# Patient Record
Sex: Male | Born: 1980 | Hispanic: No | Marital: Married | Smoking: Never smoker
Health system: Southern US, Community
[De-identification: ages and names within clinical notes are randomized; demographics above are authoritative.]

## PROBLEM LIST (undated history)

## (undated) DIAGNOSIS — A159 Respiratory tuberculosis unspecified: Secondary | ICD-10-CM

---

## 2000-07-12 HISTORY — PX: HERNIA REPAIR: SHX51

## 2005-07-14 ENCOUNTER — Ambulatory Visit: Payer: Self-pay | Admitting: Otolaryngology

## 2010-08-24 ENCOUNTER — Ambulatory Visit: Payer: Self-pay

## 2011-07-13 DIAGNOSIS — A159 Respiratory tuberculosis unspecified: Secondary | ICD-10-CM

## 2011-07-13 HISTORY — DX: Respiratory tuberculosis unspecified: A15.9

## 2014-04-22 ENCOUNTER — Emergency Department: Payer: Self-pay | Admitting: Emergency Medicine

## 2014-04-22 LAB — COMPREHENSIVE METABOLIC PANEL
ALBUMIN: 4.4 g/dL (ref 3.4–5.0)
Alkaline Phosphatase: 103 U/L
Anion Gap: 7 (ref 7–16)
BUN: 7 mg/dL (ref 7–18)
Bilirubin,Total: 0.5 mg/dL (ref 0.2–1.0)
Calcium, Total: 8.8 mg/dL (ref 8.5–10.1)
Chloride: 103 mmol/L (ref 98–107)
Co2: 30 mmol/L (ref 21–32)
Creatinine: 0.9 mg/dL (ref 0.60–1.30)
EGFR (African American): >60
EGFR (Non-African Amer.): >60
Glucose: 85 mg/dL (ref 65–99)
OSMOLALITY: 277 (ref 275–301)
Potassium: 3.5 mmol/L (ref 3.5–5.1)
SGOT(AST): 37 U/L (ref 15–37)
SGPT (ALT): 56 U/L
Sodium: 140 mmol/L (ref 136–145)
Total Protein: 8.4 g/dL — ABNORMAL HIGH (ref 6.4–8.2)

## 2014-04-22 LAB — CBC WITH DIFFERENTIAL/PLATELET
BASOS PCT: 0.3 %
Basophil #: 0 10*3/uL (ref 0.0–0.1)
Eosinophil #: 0.1 10*3/uL (ref 0.0–0.7)
Eosinophil %: 1 %
HCT: 45.8 % (ref 40.0–52.0)
HGB: 14.4 g/dL (ref 13.0–18.0)
LYMPHS PCT: 34.7 %
Lymphocyte #: 3.3 10*3/uL (ref 1.0–3.6)
MCH: 26.2 pg (ref 26.0–34.0)
MCHC: 31.5 g/dL — ABNORMAL LOW (ref 32.0–36.0)
MCV: 83 fL (ref 80–100)
Monocyte #: 0.9 x10 3/mm (ref 0.2–1.0)
Monocyte %: 9.3 %
Neutrophil #: 5.1 10*3/uL (ref 1.4–6.5)
Neutrophil %: 54.7 %
PLATELETS: 261 10*3/uL (ref 150–440)
RBC: 5.5 10*6/uL (ref 4.40–5.90)
RDW: 13.9 % (ref 11.5–14.5)
WBC: 9.4 10*3/uL (ref 3.8–10.6)

## 2014-04-22 LAB — PROTIME-INR
INR: 1.1
Prothrombin Time: 13.8 secs (ref 11.5–14.7)

## 2014-04-22 LAB — APTT: Activated PTT: 27.9 secs (ref 23.6–35.9)

## 2014-04-22 LAB — TROPONIN I: Troponin-I: <0.02 ng/mL

## 2016-07-12 HISTORY — PX: SEPTOPLASTY: SUR1290

## 2018-07-19 ENCOUNTER — Encounter: Payer: Self-pay | Admitting: *Deleted

## 2018-07-19 ENCOUNTER — Other Ambulatory Visit: Payer: Self-pay

## 2018-07-24 NOTE — Discharge Instructions (Signed)
[facility] °[facility] °ENDOSCOPIC SINUS SURGERY ° [facility] ° °What is Functional Endoscopic Sinus Surgery? ° The Surgery involves making the natural openings of the sinuses larger by removing the bony partitions that separate the sinuses from the nasal cavity.  The natural sinus lining is preserved as much as possible to allow the sinuses to resume normal function after the surgery.  In some patients nasal polyps (excessively swollen lining of the sinuses) may be removed to relieve obstruction of the sinus openings.  The surgery is performed through the nose using lighted scopes, which eliminates the need for incisions on the face.  A septoplasty is a different procedure which is sometimes performed with sinus surgery.  It involves straightening the boy partition that separates the two sides of your nose.  A crooked or deviated septum may need repair if is obstructing the sinuses or nasal airflow.  Turbinate reduction is also often performed during sinus surgery.  The turbinates are bony proturberances from the side walls of the nose which swell and can obstruct the nose in patients with sinus and allergy problems.  Their size can be surgically reduced to help relieve nasal obstruction. ° °What Can Sinus Surgery Do For Me? ° Sinus surgery can reduce the frequency of sinus infections requiring antibiotic treatment.  This can provide improvement in nasal congestion, post-nasal drainage, facial pressure and nasal obstruction.  Surgery will NOT prevent you from ever having an infection again, so it usually only for patients who get infections 4 or more times yearly requiring antibiotics, or for infections that do not clear with antibiotics.  It will not cure nasal allergies, so patients with allergies may still require medication to treat their allergies after surgery. Surgery may improve headaches related to sinusitis, however, some people will continue to  require medication to control sinus headaches related to allergies.  Surgery will do nothing for other forms of headache (migraine, tension or cluster). ° °What Are the Risks of Endoscopic Sinus Surgery? ° Current techniques allow surgery to be performed safely with little risk, however, there are rare complications that patients should be aware of.  Because the sinuses are located around the eyes, there is risk of eye injury, including blindness, though again, this would be quite rare. This is usually a result of bleeding behind the eye during surgery, which puts the vision oat risk, though there are treatments to protect the vision and prevent permanent disrupted by surgery causing a leak of the spinal fluid that surrounds the brain.  More serious complications would include bleeding inside the brain cavity or damage to the brain.  Again, all of these complications are uncommon, and spinal fluid leaks can be safely managed surgically if they occur.  The most common complication of sinus surgery is bleeding from the nose, which may require packing or cauterization of the nose.  Continued sinus have polyps may experience recurrence of the polyps requiring revision surgery.  Alterations of sense of smell or injury to the tear ducts are also rare complications.  ° °What is the Surgery Like, and what is the Recovery? ° The Surgery usually takes a couple of hours to perform, and is usually performed under a general anesthetic (completely asleep).  Patients are usually discharged home after a couple of hours.  Sometimes during surgery it is necessary to pack the nose to control bleeding, and the packing is left in place for 24 - 48 hours, and removed by your surgeon.    If a septoplasty was performed during the procedure, there is often a splint placed which must be removed after 5-7 days.   °Discomfort: Pain is usually mild to moderate, and can be controlled by prescription pain medication or acetaminophen (Tylenol).   Aspirin, Ibuprofen (Advil, Motrin), or Naprosyn (Aleve) should be avoided, as they can cause increased bleeding.  Most patients feel sinus pressure like they have a bad head cold for several days.  Sleeping with your head elevated can help reduce swelling and facial pressure, as can ice packs over the face.  A humidifier may be helpful to keep the mucous and blood from drying in the nose.  ° °Diet: There are no specific diet restrictions, however, you should generally start with clear liquids and a light diet of bland foods because the anesthetic can cause some nausea.  Advance your diet depending on how your stomach feels.  Taking your pain medication with food will often help reduce stomach upset which pain medications can cause. ° °Nasal Saline Irrigation: It is important to remove blood clots and dried mucous from the nose as it is healing.  This is done by having you irrigate the nose at least 3 - 4 times daily with a salt water solution.  We recommend using NeilMed Sinus Rinse (available at the drug store).  Fill the squeeze bottle with the solution, bend over a sink, and insert the tip of the squeeze bottle into the nose ½ of an inch.  Point the tip of the squeeze bottle towards the inside corner of the eye on the same side your irrigating.  Squeeze the bottle and gently irrigate the nose.  If you bend forward as you do this, most of the fluid will flow back out of the nose, instead of down your throat.   The solution should be warm, near body temperature, when you irrigate.   Each time you irrigate, you should use a full squeeze bottle.  ° °Note that if you are instructed to use Nasal Steroid Sprays at any time after your surgery, irrigate with saline BEFORE using the steroid spray, so you do not wash it all out of the nose. °Another product, Nasal Saline Gel (such as AYR Nasal Saline Gel) can be applied in each nostril 3 - 4 times daily to moisture the nose and reduce scabbing or crusting. ° °Bleeding:   Bloody drainage from the nose can be expected for several days, and patients are instructed to irrigate their nose frequently with salt water to help remove mucous and blood clots.  The drainage may be dark red or brown, though some fresh blood may be seen intermittently, especially after irrigation.  Do not blow you nose, as bleeding may occur. If you must sneeze, keep your mouth open to allow air to escape through your mouth. ° °If heavy bleeding occurs: Irrigate the nose with saline to rinse out clots, then spray the nose 3 - 4 times with Afrin Nasal Decongestant Spray.  The spray will constrict the blood vessels to slow bleeding.  Pinch the lower half of your nose shut to apply pressure, and lay down with your head elevated.  Ice packs over the nose may help as well. If bleeding persists despite these measures, you should notify your doctor.  Do not use the Afrin routinely to control nasal congestion after surgery, as it can result in worsening congestion and may affect healing.  ° °Activity: Return to work varies among patients. Most patients will be out of   work at least 5 - 7 days to recover.  Patient may return to work after they are off of narcotic pain medication, and feeling well enough to perform the functions of their job.  Patients must avoid heavy lifting (over 10 pounds) or strenuous physical for 2 weeks after surgery, so your employer may need to assign you to light duty, or keep you out of work longer if light duty is not possible.  NOTE: you should not drive, operate dangerous machinery, do any mentally demanding tasks or make any important legal or financial decisions while on narcotic pain medication and recovering from the general anesthetic.  °  °Call Your Doctor Immediately if You Have Any of the Following: °1. Bleeding that you cannot control with the above measures °2. Loss of vision, double vision, bulging of the eye or black eyes. °3. Fever over 101 degrees °4. Neck stiffness with severe  headache, fever, nausea and change in mental state. °You are always encourage to call anytime with concerns, however, please call with requests for pain medication refills during office hours. ° °Office Endoscopy: During follow-up visits your doctor will remove any packing or splints that may have been placed and evaluate and clean your sinuses endoscopically.  Topical anesthetic will be used to make this as comfortable as possible, though you may want to take your pain medication prior to the visit.  How often this will need to be done varies from patient to patient.  After complete recovery from the surgery, you may need follow-up endoscopy from time to time, particularly if there is concern of recurrent infection or nasal polyps. ° ° °General Anesthesia, Adult, Care After °This sheet gives you information about how to care for yourself after your procedure. Your health care provider may also give you more specific instructions. If you have problems or questions, contact your health care provider. °What can I expect after the procedure? °After the procedure, the following side effects are common: °· Pain or discomfort at the IV site. °· Nausea. °· Vomiting. °· Sore throat. °· Trouble concentrating. °· Feeling cold or chills. °· Weak or tired. °· Sleepiness and fatigue. °· Soreness and body aches. These side effects can affect parts of the body that were not involved in surgery. °Follow these instructions at home: ° °For at least 24 hours after the procedure: °· Have a responsible adult stay with you. It is important to have someone help care for you until you are awake and alert. °· Rest as needed. °· Do not: °? Participate in activities in which you could fall or become injured. °? Drive. °? Use heavy machinery. °? Drink alcohol. °? Take sleeping pills or medicines that cause drowsiness. °? Make important decisions or sign legal documents. °? Take care of children on your own. °Eating and drinking °· Follow any  instructions from your health care provider about eating or drinking restrictions. °· When you feel hungry, start by eating small amounts of foods that are soft and easy to digest (bland), such as toast. Gradually return to your regular diet. °· Drink enough fluid to keep your urine pale yellow. °· If you vomit, rehydrate by drinking water, juice, or clear broth. °General instructions °· If you have sleep apnea, surgery and certain medicines can increase your risk for breathing problems. Follow instructions from your health care provider about wearing your sleep device: °? Anytime you are sleeping, including during daytime naps. °? While taking prescription pain medicines, sleeping medicines, or medicines that make   you drowsy. °· Return to your normal activities as told by your health care provider. Ask your health care provider what activities are safe for you. °· Take over-the-counter and prescription medicines only as told by your health care provider. °· If you smoke, do not smoke without supervision. °· Keep all follow-up visits as told by your health care provider. This is important. °Contact a health care provider if: °· You have nausea or vomiting that does not get better with medicine. °· You cannot eat or drink without vomiting. °· You have pain that does not get better with medicine. °· You are unable to pass urine. °· You develop a skin rash. °· You have a fever. °· You have redness around your IV site that gets worse. °Get help right away if: °· You have difficulty breathing. °· You have chest pain. °· You have blood in your urine or stool, or you vomit blood. °Summary °· After the procedure, it is common to have a sore throat or nausea. It is also common to feel tired. °· Have a responsible adult stay with you for the first 24 hours after general anesthesia. It is important to have someone help care for you until you are awake and alert. °· When you feel hungry, start by eating small amounts of foods  that are soft and easy to digest (bland), such as toast. Gradually return to your regular diet. °· Drink enough fluid to keep your urine pale yellow. °· Return to your normal activities as told by your health care provider. Ask your health care provider what activities are safe for you. °This information is not intended to replace advice given to you by your health care provider. Make sure you discuss any questions you have with your health care provider. °Document Released: 10/04/2000 Document Revised: 02/11/2017 Document Reviewed: 02/11/2017 °Elsevier Interactive Patient Education © 2019 [employer] ° °

## 2018-07-25 ENCOUNTER — Ambulatory Visit: Payer: Managed Care, Other (non HMO) | Admitting: Anesthesiology

## 2018-07-25 ENCOUNTER — Ambulatory Visit
Admission: RE | Admit: 2018-07-25 | Discharge: 2018-07-25 | Disposition: A | Discharge status: Home / Self Care | Payer: Managed Care, Other (non HMO) | Attending: Otolaryngology | Admitting: Otolaryngology

## 2018-07-25 ENCOUNTER — Encounter
Admission: RE | Disposition: A | Discharge status: Home / Self Care | Payer: Self-pay | Source: Home / Self Care | Attending: Otolaryngology

## 2018-07-25 DIAGNOSIS — J3489 Other specified disorders of nose and nasal sinuses: Secondary | ICD-10-CM | POA: Insufficient documentation

## 2018-07-25 DIAGNOSIS — J329 Chronic sinusitis, unspecified: Secondary | ICD-10-CM | POA: Diagnosis not present

## 2018-07-25 DIAGNOSIS — Z79899 Other long term (current) drug therapy: Secondary | ICD-10-CM | POA: Diagnosis not present

## 2018-07-25 DIAGNOSIS — J339 Nasal polyp, unspecified: Secondary | ICD-10-CM | POA: Insufficient documentation

## 2018-07-25 DIAGNOSIS — J342 Deviated nasal septum: Secondary | ICD-10-CM | POA: Insufficient documentation

## 2018-07-25 DIAGNOSIS — J328 Other chronic sinusitis: Secondary | ICD-10-CM | POA: Insufficient documentation

## 2018-07-25 HISTORY — PX: POLYPECTOMY: SHX149

## 2018-07-25 HISTORY — DX: Respiratory tuberculosis unspecified: A15.9

## 2018-07-25 HISTORY — PX: IMAGE GUIDED SINUS SURGERY: SHX6570

## 2018-07-25 HISTORY — PX: ETHMOIDECTOMY: SHX5197

## 2018-07-25 HISTORY — PX: SEPTOPLASTY: SHX2393

## 2018-07-25 HISTORY — PX: MAXILLARY ANTROSTOMY: SHX2003

## 2018-07-25 SURGERY — SINUS SURGERY, WITH IMAGING GUIDANCE
Anesthesia: General | Site: Nose | Laterality: Bilateral

## 2018-07-25 MED ORDER — OXYCODONE HCL 5 MG/5ML PO SOLN: 5.0000 mg | Freq: Once | ORAL | Status: AC | PRN

## 2018-07-25 MED ORDER — PROPOFOL 10 MG/ML IV BOLUS
INTRAVENOUS | Status: DC | PRN
  Administered 2018-07-25: 150 mg via INTRAVENOUS

## 2018-07-25 MED ORDER — DEXAMETHASONE SODIUM PHOSPHATE 4 MG/ML IJ SOLN
INTRAMUSCULAR | Status: DC | PRN
  Administered 2018-07-25: 10 mg via INTRAVENOUS

## 2018-07-25 MED ORDER — LIDOCAINE-EPINEPHRINE 1 %-1:100000 IJ SOLN
INTRAMUSCULAR | Status: DC | PRN
  Administered 2018-07-25: 10 mL

## 2018-07-25 MED ORDER — OXYMETAZOLINE HCL 0.05 % NA SOLN
NASAL | Status: DC | PRN
  Administered 2018-07-25: 1 via TOPICAL

## 2018-07-25 MED ORDER — HYDROCODONE-ACETAMINOPHEN 5-325 MG PO TABS: 1.0000 | ORAL_TABLET | ORAL | 0 refills | Status: DC | PRN

## 2018-07-25 MED ORDER — LIDOCAINE HCL (CARDIAC) PF 100 MG/5ML IV SOSY
PREFILLED_SYRINGE | INTRAVENOUS | Status: DC | PRN
  Administered 2018-07-25: 40 mg via INTRAVENOUS

## 2018-07-25 MED ORDER — LACTATED RINGERS IV SOLN
INTRAVENOUS | Status: DC | PRN
  Administered 2018-07-25 (×2): via INTRAVENOUS

## 2018-07-25 MED ORDER — FENTANYL CITRATE (PF) 100 MCG/2ML IJ SOLN
INTRAMUSCULAR | Status: DC | PRN
  Administered 2018-07-25: 50 ug via INTRAVENOUS
  Administered 2018-07-25: 100 ug via INTRAVENOUS
  Administered 2018-07-25: 25 ug via INTRAVENOUS
  Administered 2018-07-25 (×2): 50 ug via INTRAVENOUS
  Administered 2018-07-25: 25 ug via INTRAVENOUS

## 2018-07-25 MED ORDER — LABETALOL HCL 5 MG/ML IV SOLN
INTRAVENOUS | Status: DC | PRN
  Administered 2018-07-25 (×2): 5 mg via INTRAVENOUS

## 2018-07-25 MED ORDER — HYDROMORPHONE HCL 1 MG/ML IJ SOLN: 0.2500 mg | INTRAMUSCULAR | Status: DC | PRN

## 2018-07-25 MED ORDER — GLYCOPYRROLATE 0.2 MG/ML IJ SOLN
INTRAMUSCULAR | Status: DC | PRN
  Administered 2018-07-25: 0.1 mg via INTRAVENOUS

## 2018-07-25 MED ORDER — OXYCODONE HCL 5 MG PO TABS
5.0000 mg | ORAL_TABLET | Freq: Once | ORAL | Status: AC | PRN
  Administered 2018-07-25: 5 mg via ORAL

## 2018-07-25 MED ORDER — ONDANSETRON HCL 4 MG/2ML IJ SOLN
INTRAMUSCULAR | Status: DC | PRN
  Administered 2018-07-25: 4 mg via INTRAVENOUS

## 2018-07-25 MED ORDER — ROCURONIUM BROMIDE 100 MG/10ML IV SOLN
INTRAVENOUS | Status: DC | PRN
  Administered 2018-07-25: 25 mg via INTRAVENOUS

## 2018-07-25 MED ORDER — MIDAZOLAM HCL 5 MG/5ML IJ SOLN
INTRAMUSCULAR | Status: DC | PRN
  Administered 2018-07-25: 2 mg via INTRAVENOUS

## 2018-07-25 SURGICAL SUPPLY — 46 items
BATTERY INSTRU NAVIGATION (MISCELLANEOUS) ×9 IMPLANT
BLADE SURG 15 STRL LF DISP TIS (BLADE) IMPLANT
BLADE SURG 15 STRL SS (BLADE) ×2
BOWL GRADUATED 16OZ PLASTIC (MISCELLANEOUS) ×4 IMPLANT
CANISTER SUCT 1200ML W/VALVE (MISCELLANEOUS) ×3 IMPLANT
COAGULATOR SUCT 8FR VV (MISCELLANEOUS) ×2 IMPLANT
COUNTER NEEDLE 20/40 LG (NEEDLE) ×2 IMPLANT
COVER LIGHT HANDLE FLEXIBLE (MISCELLANEOUS) ×4 IMPLANT
COVER MAYO STAND STRL (DRAPES) ×2 IMPLANT
COVER TABLE BACK 60X90 (DRAPES) ×2 IMPLANT
CUP MEDICINE 2OZ PLAST GRAD ST (MISCELLANEOUS) ×8 IMPLANT
DRAPE EENT SPLIT AURORA (DRAPES) ×2 IMPLANT
DRAPE HEAD BAR (DRAPES) ×2 IMPLANT
DRESSING NASL FOAM PST OP SINU (MISCELLANEOUS) IMPLANT
DRSG NASAL FOAM POST OP SINU (MISCELLANEOUS) ×6
ELECT REM PT RETURN 9FT ADLT (ELECTROSURGICAL) ×3
ELECTRODE REM PT RTRN 9FT ADLT (ELECTROSURGICAL) ×1 IMPLANT
GLOVE BIO SURGEON STRL SZ7.5 (GLOVE) ×8 IMPLANT
GOWN STRL REUS W/ TWL LRG LVL3 (GOWN DISPOSABLE) ×1 IMPLANT
GOWN STRL REUS W/TWL LRG LVL3 (GOWN DISPOSABLE) ×8
IV NS 500ML (IV SOLUTION) ×2
IV NS 500ML BAXH (IV SOLUTION) ×1 IMPLANT
KIT TURNOVER KIT A (KITS) ×3 IMPLANT
MARKER SKIN DUAL TIP RULER LAB (MISCELLANEOUS) ×2 IMPLANT
NDL HYPO 25GX1X1/2 BEV (NEEDLE) IMPLANT
NEEDLE HYPO 25GX1X1/2 BEV (NEEDLE) ×3 IMPLANT
NS IRRIG 500ML POUR BTL (IV SOLUTION) ×3 IMPLANT
PACKING NASAL EPIS 4X2.4 XEROG (MISCELLANEOUS) ×2 IMPLANT
PATTIES SURGICAL .5 X3 (DISPOSABLE) ×3 IMPLANT
SHAVER DIEGO BLD STD TYPE A (BLADE) ×3 IMPLANT
SOL ANTI-FOG 6CC FOG-OUT (MISCELLANEOUS) ×1 IMPLANT
SOL FOG-OUT ANTI-FOG 6CC (MISCELLANEOUS) ×2
SPLINT NASAL SEPTAL PRE-CUT (MISCELLANEOUS) ×2 IMPLANT
SPONGE NEURO XRAY DETECT 1X3 (DISPOSABLE) ×2 IMPLANT
SUT CHROMIC 4 0 RB 1X27 (SUTURE) IMPLANT
SUT ETHILON 4-0 (SUTURE)
SUT ETHILON 4-0 FS2 18XMFL BLK (SUTURE)
SUT PLAIN GUT 4-0 (SUTURE) IMPLANT
SUTURE ETHLN 4-0 FS2 18XMF BLK (SUTURE) IMPLANT
SYR 10ML LL (SYRINGE) ×5 IMPLANT
TOWEL SURG RFD BLUE STRL DISP (DISPOSABLE) ×4 IMPLANT
TRACKER CRANIALMASK (MASK) ×3 IMPLANT
TUBING CONNECTING 10 (TUBING) ×1 IMPLANT
TUBING CONNECTING 10' (TUBING) ×1
TUBING DECLOG MULTIDEBRIDER (TUBING) ×3 IMPLANT
WATER STERILE IRR 250ML POUR (IV SOLUTION) IMPLANT

## 2018-07-25 NOTE — Anesthesia Preprocedure Evaluation (Addendum)
Anesthesia Evaluation  Patient identified by MRN, date of birth, ID band Patient awake    Reviewed: Allergy & Precautions, H&P , NPO status , Patient's Chart, lab work & pertinent test results  Airway Mallampati: II  TM Distance: >3 FB Neck ROM: full    Dental no notable dental hx.    Pulmonary neg pulmonary ROS,    Pulmonary exam normal breath sounds clear to auscultation       Cardiovascular negative cardio ROS Normal cardiovascular exam     Neuro/Psych    GI/Hepatic negative GI ROS, Neg liver ROS,   Endo/Other  negative endocrine ROS  Renal/GU negative Renal ROS     Musculoskeletal   Abdominal   Peds  Hematology negative hematology ROS (+)   Anesthesia Other Findings   Reproductive/Obstetrics negative OB ROS                             Anesthesia Physical Anesthesia Plan  ASA: I  Anesthesia Plan: General ETT   Post-op Pain Management:    Induction:   PONV Risk Score and Plan:   Airway Management Planned:   Additional Equipment:   Intra-op Plan:   Post-operative Plan:   Informed Consent: I have reviewed the patients History and Physical, chart, labs and discussed the procedure including the risks, benefits and alternatives for the proposed anesthesia with the patient or authorized representative who has indicated his/her understanding and acceptance.       Plan Discussed with:   Anesthesia Plan Comments:         Anesthesia Quick Evaluation

## 2018-07-25 NOTE — Op Note (Signed)
07/25/2018  1:59 PM    Governor Rooks Elane Fritz  [id number]   Pre-Op Diagnosis:  Nasal Polyposis, Chronic sinusitis, Septal Deviation with Nasal obstruction  Post-op Diagnosis: Same   Procedure:  1)  Image Guided Sinus Surgery,   2)  Bilateral Endoscopic Maxillary Antrostomy with Tissue Removal   3)  Bilateral Total Ethmoidectomy   4)  Bilateral Sphenoidotomy   5)  Nasal Septoplasty    Surgeon:  Sandi Mealy  Anesthesia:  General endotracheal  EBL:  200cc  Complications:  None  Findings: Right septal deviation, Bilateral extensive nasal polyposis  Procedure: After the patient was identified in holding and the benefits of the procedure were reviewed as well as the consent and risks, the patient was taken to the operating room and with the patient in a comfortable supine position,  general orotracheal anesthesia was induced without difficulty.  A proper time-out was performed.  The Stryker image guidance system was set up and calibrated in the normal fashion and felt to be acceptable.  Next 1% Xylocaine with 1:100,000 epinephrine was infiltrated into the septum, and anterior middle turbinates, polyps, and uncinate region  bilaterally.  Several minutes were allowed for this to take effect.  Cottoniod pledgets soaked in Afrin were placed into both nasal cavities and left while the patient was prepped and draped in the standard fashion. The image guided suction was calibrated and used to inspect known points in the nasal cavity to assess accuracy of the image guided system. Accuracy was felt to be excellent.   Examination of the septum revealed a broad-based synechia between the right anterior septum and lateral nasal wall. This was divided with scissors. Beginning on the left hand side a hemitransfixion incision was then created on the leading edge of the septum on the left.  A subperichondrial plane was elevated posteriorly on the left andright hand sides of the cartilage to mobilize it, and taken  back to the perpendicular plate of the ethmoid where subperiosteal plane was elevated. The cartilage was freed up from the bone for better mobilization, as it was scarred, overlapping the bone on the right. A portion of the bone was trimmed to allow the cartilage to slide into midline. An inferior rim of redundant septal cartilage was removed from where it deviated over the maxillary crest. The cartilage was scored on the left to allow relaxation of the septal cartilage into the midline.   The septum was then replaced in the midline. Reinspection through each nostril showed excellent reduction of the septal deformity. A left posterior inferior fenestration was then created to allow hematoma drainage.  The septal incision was closed with 4-0 chromic gut suture. A 4-0 plain gut suture was used to reapproximate the septal flaps to the underlying cartilage, utilizing a running, quilting type stitch.   The left middle turbinate was medialized and polyps debrided from the middle meatus.  This was initially done with forceps as the microdebrider was not working properly, and not available.  A backbiter was used to get into the left maxillary sinus, debriding polyps and uncinate bone to enter the maxillary sinus.  The sinus was suctioned of secretions.  Later in the case the microdebrider was available for use to help debride polyps from the opening of the maxillary sinus.  In this fashion the uncinate was completely removed along with soft tissue and bone of the medial wall of the maxillary sinus to create a large patent maxillary antrostomy. The left maxillary sinus was suctioned to clear secretions.  Next polyps were debrided from the ethmoid sinuses, again initially using cup forceps and through-cutting forceps to enter through the basal lamella into the posterior ethmoids.  Later the microdebrider was used as needed to further debride polyps and open the ethmoid sinuses widely.  The image guided suction was used  regularly throughout the case to confirm the anatomy and help avoid injury to the skull base and lamina papyracea.  Next the scope was passed medial to the middle turbinate and the left sphenoid recess inspected.  Polyps were debrided from this region and were fairly extensive.  With the assistance of the image guided system, the sphenoid sinus was carefully entered through some thin bone and polyps at the anterior face of the spenoid, medial to the superior turbinate.  Polyps were debrided creating a fairly large sphenoidotomy, as the natural ostia of the sphenoid appeared to have been dilated by the presence of polyps.  The microdebrider was used to further debride polyps from this region creating wide patency of the sphenoid sinus.    Attention was then turned to the right side where the same procedure was performed, opening the maxillary sinus, ethmoid cavities, and sphenoid sinus in the same fashion as described above, and debriding significant polyps from the sinuses.  Once both sides were completed, both sides were inspected for residual bleeding.  Suction cautery was used to cauterize areas of bleeding in the sphenoid recess bilaterally and posterior ethmoid region.  A combination of xerogel and Stammberger absorbable packing was placed into the ethmoids on both sides to control residual oozing.  With the sinus surgery completed bilaterally and no active bleeding, nasal septal splints were placed within each nostril and affixed to the septum using a 3-0 nylon suture.   The nose was suctioned and inspected. The maxillary sinuses were irrigated with saline. Stammberger absorbable sinus packing was then placed in the ethmoid cavities bilaterally.   The patient was then returned to the anesthesiologist for awakening and taken to recovery room in good condition postoperatively.  Disposition:   PACU and d/c home  Plan: Ice, elevation, narcotic analgesia and prophylactic antibiotics. Begin sinus  irrigations with saline tomrrow, irrigating 3-4 times daily. Return to the office in 7 days.  Return to work in 7-10 days, no strenuous activities for two weeks.   Sandi Mealy 07/25/2018 1:59 PM

## 2018-07-25 NOTE — Anesthesia Postprocedure Evaluation (Signed)
Anesthesia Post Note  Patient: Jeremiah Hudson  Procedure(s) Performed: IMAGE GUIDED SINUS SURGERY (Bilateral Nose) POLYPECTOMY NASAL (Bilateral Nose) MAXILLARY ANTROSTOMY WITH TISSUE REMOVAL (Bilateral Nose) ETHMOIDECTOMY AND SPHENOIDOTOMY (Bilateral Nose) REVISION SEPTOPLASTY (Bilateral Nose)  Patient location during evaluation: PACU Anesthesia Type: General Level of consciousness: awake and alert Pain management: pain level controlled Vital Signs Assessment: post-procedure vital signs reviewed and stable Respiratory status: spontaneous breathing Cardiovascular status: blood pressure returned to baseline Anesthetic complications: no    Verner Chol, III,  Caydan Mctavish D

## 2018-07-25 NOTE — Anesthesia Procedure Notes (Signed)
Procedure Name: Intubation Date/Time: 07/25/2018 10:30 AM Performed by: Jimmy Picket, CRNA Pre-anesthesia Checklist: Patient identified, Emergency Drugs available, Suction available, Patient being monitored and Timeout performed Patient Re-evaluated:Patient Re-evaluated prior to induction Oxygen Delivery Method: Circle system utilized Preoxygenation: Pre-oxygenation with 100% oxygen Induction Type: IV induction Ventilation: Mask ventilation without difficulty Laryngoscope Size: Miller and 3 Grade View: Grade I Tube type: Oral Rae Tube size: 7.5 mm Number of attempts: 1 Placement Confirmation: ETT inserted through vocal cords under direct vision,  positive ETCO2 and breath sounds checked- equal and bilateral Tube secured with: Tape Dental Injury: Teeth and Oropharynx as per pre-operative assessment

## 2018-07-25 NOTE — Transfer of Care (Signed)
Immediate Anesthesia Transfer of Care Note  Patient: Jeremiah Hudson  Procedure(s) Performed: IMAGE GUIDED SINUS SURGERY (Bilateral Nose) POLYPECTOMY NASAL (Bilateral Nose) MAXILLARY ANTROSTOMY WITH TISSUE REMOVAL (Bilateral Nose) ETHMOIDECTOMY AND SPHENOIDOTOMY (Bilateral Nose) REVISION SEPTOPLASTY (Bilateral Nose)  Patient Location: PACU  Anesthesia Type: General ETT  Level of Consciousness: awake, alert  and patient cooperative  Airway and Oxygen Therapy: Patient Spontanous Breathing and Patient connected to supplemental oxygen  Post-op Assessment: Post-op Vital signs reviewed, Patient's Cardiovascular Status Stable, Respiratory Function Stable, Patent Airway and No signs of Nausea or vomiting  Post-op Vital Signs: Reviewed and stable  Complications: No apparent anesthesia complications

## 2018-07-25 NOTE — H&P (Signed)
History and physical reviewed and will be scanned in later. No change in medical status reported by the patient or family, appears stable for surgery. All questions regarding the procedure answered, and patient (or family if a child) expressed understanding of the procedure. ? ?Tregan Read S Jamier Urbas ?@TODAY@ ?

## 2018-07-26 ENCOUNTER — Encounter: Payer: Self-pay | Admitting: Otolaryngology

## 2018-07-31 LAB — SURGICAL PATHOLOGY

## 2018-10-19 ENCOUNTER — Other Ambulatory Visit: Payer: Self-pay

## 2018-10-19 ENCOUNTER — Encounter: Payer: Self-pay | Admitting: Emergency Medicine

## 2018-10-19 ENCOUNTER — Emergency Department: Payer: Managed Care, Other (non HMO)

## 2018-10-19 ENCOUNTER — Emergency Department
Admission: EM | Admit: 2018-10-19 | Discharge: 2018-10-19 | Disposition: A | Discharge status: Home / Self Care | Payer: Managed Care, Other (non HMO) | Attending: Emergency Medicine | Admitting: Emergency Medicine

## 2018-10-19 DIAGNOSIS — R059 Cough, unspecified: Secondary | ICD-10-CM

## 2018-10-19 DIAGNOSIS — R0789 Other chest pain: Secondary | ICD-10-CM | POA: Diagnosis present

## 2018-10-19 DIAGNOSIS — R05 Cough: Secondary | ICD-10-CM | POA: Diagnosis not present

## 2018-10-19 DIAGNOSIS — Z79899 Other long term (current) drug therapy: Secondary | ICD-10-CM | POA: Diagnosis not present

## 2018-10-19 LAB — CBC WITH DIFFERENTIAL/PLATELET
Abs Immature Granulocytes: 0.01 10*3/uL (ref 0.00–0.07)
Basophils Absolute: 0 10*3/uL (ref 0.0–0.1)
Basophils Relative: 1 %
Eosinophils Absolute: 0.3 10*3/uL (ref 0.0–0.5)
Eosinophils Relative: 4 %
HCT: 45.3 % (ref 39.0–52.0)
Hemoglobin: 14.6 g/dL (ref 13.0–17.0)
Immature Granulocytes: 0 %
Lymphocytes Relative: 30 %
Lymphs Abs: 2.4 10*3/uL (ref 0.7–4.0)
MCH: 26.8 pg (ref 26.0–34.0)
MCHC: 32.2 g/dL (ref 30.0–36.0)
MCV: 83.3 fL (ref 80.0–100.0)
Monocytes Absolute: 0.6 10*3/uL (ref 0.1–1.0)
Monocytes Relative: 8 %
Neutro Abs: 4.7 10*3/uL (ref 1.7–7.7)
Neutrophils Relative %: 57 %
Platelets: 302 10*3/uL (ref 150–400)
RBC: 5.44 MIL/uL (ref 4.22–5.81)
RDW: 13.7 % (ref 11.5–15.5)
WBC: 8 10*3/uL (ref 4.0–10.5)
nRBC: 0 % (ref 0.0–0.2)

## 2018-10-19 LAB — COMPREHENSIVE METABOLIC PANEL
ALT: 45 U/L — ABNORMAL HIGH (ref 0–44)
AST: 28 U/L (ref 15–41)
Albumin: 4.5 g/dL (ref 3.5–5.0)
Alkaline Phosphatase: 115 U/L (ref 38–126)
Anion gap: 9 (ref 5–15)
BUN: 9 mg/dL (ref 6–20)
CO2: 25 mmol/L (ref 22–32)
Calcium: 9.4 mg/dL (ref 8.9–10.3)
Chloride: 106 mmol/L (ref 98–111)
Creatinine, Ser: 0.79 mg/dL (ref 0.61–1.24)
GFR calc Af Amer: >60 mL/min (ref >60)
GFR calc non Af Amer: >60 mL/min (ref >60)
Glucose, Bld: 157 mg/dL — ABNORMAL HIGH (ref 70–99)
Potassium: 3.8 mmol/L (ref 3.5–5.1)
Sodium: 140 mmol/L (ref 135–145)
Total Bilirubin: 0.4 mg/dL (ref 0.3–1.2)
Total Protein: 8 g/dL (ref 6.5–8.1)

## 2018-10-19 LAB — BRAIN NATRIURETIC PEPTIDE: B Natriuretic Peptide: 14 pg/mL (ref 0.0–100.0)

## 2018-10-19 LAB — FIBRIN DERIVATIVES D-DIMER (ARMC ONLY): Fibrin derivatives D-dimer (ARMC): 416.95 ng/mL (FEU) (ref 0.00–499.00)

## 2018-10-19 LAB — TROPONIN I: Troponin I: <0.03 ng/mL (ref <0.03)

## 2018-10-19 NOTE — ED Triage Notes (Addendum)
Pt c/o CP and chills. Pt was tested for covid-19 last week and was neg but states symptoms are not improving. PT has + travel to [place] x43month ago. PT in NAD, appears anxious about having covid virus

## 2018-10-19 NOTE — ED Notes (Signed)
[phone] pt cell phone number

## 2018-10-19 NOTE — Discharge Instructions (Addendum)
You have some of the symptoms of the coronavirus infection but not most of them.  The lab test that we have done today are negative.  Please continue quarantine until you been without any fever for at least 3 days without any fever medicine and your cough is improved.  If you get worse develop a fever with a thermometer or get more short of breath please return here at once.

## 2018-10-19 NOTE — ED Provider Notes (Signed)
[facility] Emergency Department Provider Note   ____________________________________________   First MD Initiated Contact with Patient 10/19/18 [phone]     (approximate)  I have reviewed the triage vital signs and the nursing notes.   HISTORY  Chief Complaint Chest Pain    HPI Jeremiah Hudson is a 38 y.o. male patient reports he traveled to [place] about a month ago.  He has been complaining of pleuritic chest pain dry cough and aching all over.  He feels chills at night.  Sometimes he feels short of breath at night.  [facility] for coronavirus which was negative.  He is worried he might of had a false negative that something else is going on.  He is tachycardic but his heart rate dropped down to 100 from 115 while talking to him.   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      Past Medical History:  Diagnosis Date  . Tuberculosis 2013   took treatment - meds for 6 months    There are no active problems to display for this patient.   Past Surgical History:  Procedure Laterality Date  . ETHMOIDECTOMY Bilateral 07/25/2018   Procedure: ETHMOIDECTOMY AND SPHENOIDOTOMY;  Surgeon: Geanie LoganBennett, Breon Diss, MD;  [facility];  Service: ENT;  Laterality: Bilateral;  . HERNIA REPAIR  2002   [facility]  . IMAGE GUIDED SINUS SURGERY Bilateral 07/25/2018   Procedure: IMAGE GUIDED SINUS SURGERY;  Surgeon: Geanie LoganBennett, Daliah Chaudoin, MD;  [facility];  Service: ENT;  Laterality: Bilateral;  NEED STRYKER DISK gave stryker disk to cece 1-06 kp  . MAXILLARY ANTROSTOMY Bilateral 07/25/2018   Procedure: MAXILLARY ANTROSTOMY WITH TISSUE REMOVAL;  Surgeon: Geanie LoganBennett, Yazlin Ekblad, MD;  [facility];  Service: ENT;  Laterality: Bilateral;  . POLYPECTOMY Bilateral 07/25/2018   Procedure: POLYPECTOMY NASAL;  Surgeon: Geanie LoganBennett, Usama Harkless, MD;  [facility];  Service: ENT;   Laterality: Bilateral;  . SEPTOPLASTY  2018  . SEPTOPLASTY Bilateral 07/25/2018   Procedure: REVISION SEPTOPLASTY;  Surgeon: Geanie LoganBennett, Lititia Sen, MD;  [facility];  Service: ENT;  Laterality: Bilateral;    Prior to Admission medications   Medication Sig Start Date End Date Taking? Authorizing Provider  azelastine (ASTELIN) 0.1 % nasal spray Place into both nostrils 2 (two) times daily. Use in each nostril as directed    [provider]  cefdinir (OMNICEF) 300 MG capsule Take 300 mg by mouth 2 (two) times daily.    [provider]  HYDROcodone-acetaminophen (NORCO/VICODIN) 5-325 MG tablet Take 1-2 tablets by mouth every 4 (four) hours as needed for moderate pain. 07/25/18   Geanie LoganBennett, Amana Bouska, MD  mometasone (NASONEX) 50 MCG/ACT nasal spray Place 2 sprays into the nose daily.    [provider]  predniSONE (DELTASONE) 10 MG tablet Take 10 mg by mouth daily with breakfast. 12 day taper    [provider]    Allergies Patient has no known allergies.  History reviewed. No pertinent family history.  Social History Social History   Tobacco Use  . Smoking status: Never Smoker  . Smokeless tobacco: Never Used  Substance Use Topics  . Alcohol use: Never    Frequency: Never  . Drug use: Not on file    Review of Systems  Constitutional: No fever he does have chills Eyes: No visual changes. ENT: No sore throat. Cardiovascular: Denies chest pain. Respiratory: Some nighttime shortness of  breath. Gastrointestinal: No abdominal pain.  No nausea, no vomiting.  No diarrhea.  No constipation. Genitourinary: Negative for dysuria. Musculoskeletal: Negative for back pain. Skin: Negative for rash. Neurological: Negative for headaches, focal weakness  ____________________________________________   PHYSICAL EXAM:  VITAL SIGNS: ED Triage Vitals [10/19/18 0939]  Enc Vitals Group     BP (!) 145/85     Pulse Rate (!) 110     Resp 16     Temp 98.3 F  (36.8 C)     Temp Source Oral     SpO2 100 %     Weight      Height      Head Circumference      Peak Flow      Pain Score 2     Pain Loc      Pain Edu?      Excl. in GC?     Constitutional: Alert and oriented. Well appearing and in no acute distress who looks somewhat anxious. Eyes: Conjunctivae are normal.  Head: Atraumatic. Nose: No congestion/rhinnorhea. Mouth/Throat: Mucous membranes are moist.  Oropharynx non-erythematous. Neck: No stridor.  Cardiovascular: Normal rate, regular rhythm. Grossly normal heart sounds.  Good peripheral circulation. Respiratory: Normal respiratory effort.  No retractions. Lungs CTAB. Gastrointestinal: Soft and nontender. No distention. No abdominal bruits. No CVA tenderness. Musculoskeletal: No lower extremity tenderness nor edema. Neurologic:  Normal speech and language. No gross focal neurologic deficits are appreciated. No gait instability. Skin:  Skin is warm, dry and intact. No rash noted.   ____________________________________________   LABS (all labs ordered are listed, but only abnormal results are displayed)  Labs Reviewed  COMPREHENSIVE METABOLIC PANEL - Abnormal; Notable for the following components:      Result Value   Glucose, Bld 157 (*)    ALT 45 (*)    All other components within normal limits  TROPONIN I  CBC WITH DIFFERENTIAL/PLATELET  FIBRIN DERIVATIVES D-DIMER ([facility] ONLY)  BRAIN NATRIURETIC PEPTIDE   ____________________________________________  EKG  EKG read interpreted by me shows sinus tachycardia rate of 108 rightward axis no acute ST-T changes for some flattening and slight T wave inversion inferiorly ____________________________________________  RADIOLOGY  ED MD interpretation chest x-ray read as negative by radiology I did review the film.  Official radiology report(s): Dg Chest Portable 1 View  Result Date: 10/19/2018 CLINICAL DATA:  Chest pain EXAM: PORTABLE CHEST 1 VIEW COMPARISON:  08/24/2018  FINDINGS: Heart and mediastinal contours are within normal limits. No focal opacities or effusions. No acute bony abnormality. IMPRESSION: No active disease. Electronically Signed   By: Charlett Nose M.D.   On: 10/19/2018 10:25    ____________________________________________   PROCEDURES  Procedure(s) performed (including Critical Care):  Procedures   ____________________________________________   INITIAL IMPRESSION / ASSESSMENT AND PLAN / ED COURSE  Patient's lab tests are negative.  X-ray was read as negative by radiology patient is not running a fever.  He has been looking good.  We will let him go quarantine as he is being treated during the coronavirus epidemic and has some of the symptoms of coronavirus              ____________________________________________   FINAL CLINICAL IMPRESSION(S) / ED DIAGNOSES  Final diagnoses:  Cough     ED Discharge Orders    None       Note:  This document was prepared using Dragon voice recognition software and may include unintentional dictation errors.    Arnaldo Natal, MD 10/19/18 [phone]

## 2019-01-30 ENCOUNTER — Ambulatory Visit: Payer: Self-pay | Admitting: Internal Medicine

## 2019-02-07 ENCOUNTER — Other Ambulatory Visit: Payer: Self-pay | Admitting: Neurology

## 2019-02-07 DIAGNOSIS — M542 Cervicalgia: Secondary | ICD-10-CM

## 2019-02-07 DIAGNOSIS — R2689 Other abnormalities of gait and mobility: Secondary | ICD-10-CM

## 2019-02-20 ENCOUNTER — Ambulatory Visit: Admission: RE | Admit: 2019-02-20 | Payer: Managed Care, Other (non HMO) | Source: Ambulatory Visit

## 2019-02-20 ENCOUNTER — Other Ambulatory Visit: Payer: Self-pay

## 2019-02-21 ENCOUNTER — Other Ambulatory Visit: Payer: Self-pay | Admitting: Neurology

## 2019-02-21 DIAGNOSIS — G8929 Other chronic pain: Secondary | ICD-10-CM

## 2019-03-28 ENCOUNTER — Ambulatory Visit: Payer: Managed Care, Other (non HMO)

## 2019-04-09 ENCOUNTER — Ambulatory Visit: Payer: Managed Care, Other (non HMO)

## 2019-06-04 ENCOUNTER — Encounter: Payer: Self-pay | Admitting: Internal Medicine

## 2019-06-12 ENCOUNTER — Encounter: Payer: Self-pay | Admitting: *Deleted

## 2019-06-26 ENCOUNTER — Encounter: Payer: Self-pay | Admitting: Internal Medicine

## 2019-07-12 ENCOUNTER — Other Ambulatory Visit: Payer: Self-pay | Admitting: Gastroenterology

## 2019-07-12 DIAGNOSIS — R748 Abnormal levels of other serum enzymes: Secondary | ICD-10-CM

## 2019-07-12 DIAGNOSIS — R7401 Elevation of levels of liver transaminase levels: Secondary | ICD-10-CM

## 2019-12-28 IMAGING — DX PORTABLE CHEST - 1 VIEW
1 series · 1 of 1 positions shown · non-contrast
Comparison: 08/24/2018

CLINICAL DATA: Chest pain

EXAM:
PORTABLE CHEST 1 VIEW

[chest ap]
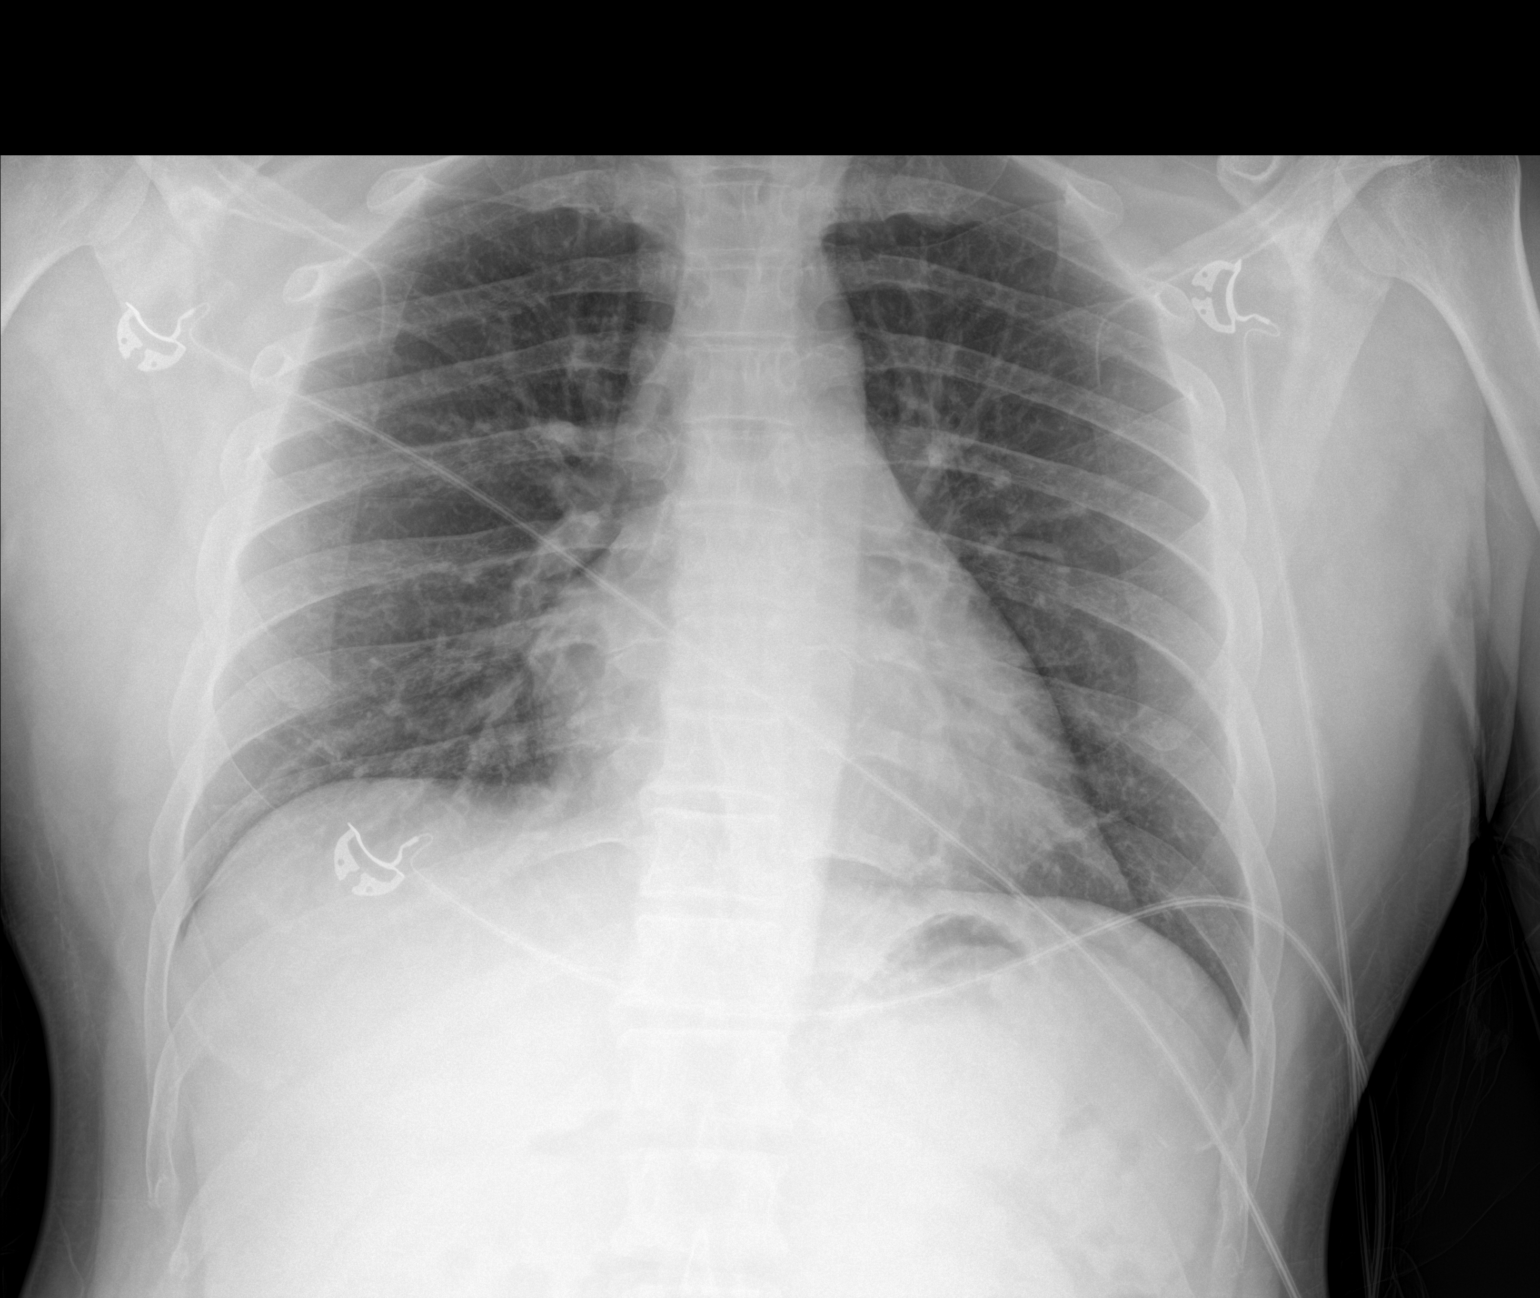

[1 of 1 positions shown; findings below may reference images not displayed]

FINDINGS: Heart and mediastinal contours are within normal limits. No focal
opacities or effusions. No acute bony abnormality.
IMPRESSION: No active disease.

## 2021-08-31 NOTE — Progress Notes (Signed)
 Review of Systems  Constitutional: Negative.   HENT: Positive for voice change.   Eyes: Negative.   Respiratory: Negative.   Cardiovascular: Negative.   Gastrointestinal: Negative.   Endocrine: Negative.   Genitourinary: Negative.   Musculoskeletal: Negative.   Skin: Negative.   Allergic/Immunologic: Negative.   Neurological: Negative.   Hematological: Negative.   Psychiatric/Behavioral: Negative.    Using our specific review of systems template, a review of systems sheet has been completed by the patient .  Positive complaints have been documented appropriately. During this visit, vital signs were obtained; patient specific allergies, falls risk assessment and current medication list have been reviewed. Any barriers to learning were recognized to ensure optimal education and comprehension.

## 2021-08-31 NOTE — Progress Notes (Signed)
 Subjective:    Jeremiah Hudson is a 41 y.o. male returns for f/u re polyps. Started Dupixent about a year ago. He feels this has helped symptoms greatly. No new complaints. Notes olfaction has returned. Notes much less congestion or PND. He talks a lot for work and note some voice fatigue, so is interest in management options. Non-smoker, no GERD.  Past Medical History:  Diagnosis Date  . Asthma, unspecified asthma severity, unspecified whether complicated, unspecified whether persistent   . GERD (gastroesophageal reflux disease)   . TB (tuberculosis) 2013   Past Surgical History:  Procedure Laterality Date  . ETHMOIDECTOMY INTRANASAL    . EXCISION NASAL POLYPS    . MAXILLARY SINUSOTOMY INTRANASAL    . SEPTOPLASTY     Current Outpatient Medications on File Prior to Visit  Medication Sig Dispense Refill  . dupilumab (DUPIXENT) 300 mg/2 mL pen injector Inject 2 mLs (300 mg total) subcutaneously every 14 (fourteen) days 4 mL 11  . fluticasone propionate (XHANCE) 93 mcg/actuation AerB Place 93 mcg into one nostril 2 (two) times daily 16 mL 11  . STIOLTO RESPIMAT 2.5-2.5 mcg/actuation inhaler USE 2 INHALATIONS BY MOUTH  INTO THE LUNGS DAILY 12 g 3  . albuterol (ACCUNEB) 1.25 mg/3 mL nebulizer solution Take 3 mLs (1.25 mg total) by nebulization every 6 (six) hours as needed for Wheezing (Patient not taking: Reported on 07/11/2019  ) 75 mL 12  . budesonide (PULMICORT) 1 mg/2 mL nebulizer solution USE 1 VIAL VIA NEBULIZER  ONCE DAILY (Patient not taking: No sig reported) 180 mL 3  . montelukast (SINGULAIR) 10 mg tablet Take 1 tablet (10 mg total) by mouth nightly 30 tablet 11   No current facility-administered medications on file prior to visit.   Social History   Socioeconomic History  . Marital status: Married  Tobacco Use  . Smoking status: Never  . Smokeless tobacco: Never  Vaping Use  . Vaping Use: Never used  Substance and Sexual Activity  . Alcohol use: Defer  . Drug use: Defer   . Sexual activity: Yes   Family History  Problem Relation Age of Onset  . Diabetes Mother     Review of Systems Evans, Jada, [place]  08/31/2021  2:12 PM  Sign when Signing Visit Review of Systems  Constitutional: Negative.   HENT: Positive for voice change.   Eyes: Negative.   Respiratory: Negative.   Cardiovascular: Negative.   Gastrointestinal: Negative.   Endocrine: Negative.   Genitourinary: Negative.   Musculoskeletal: Negative.   Skin: Negative.   Allergic/Immunologic: Negative.   Neurological: Negative.   Hematological: Negative.   Psychiatric/Behavioral: Negative.    Using our specific review of systems template, a review of systems sheet has been completed by the patient .  Positive complaints have been documented appropriately. During this visit, vital signs were obtained; patient specific allergies, falls risk assessment and current medication list have been reviewed. Any barriers to learning were recognized to ensure optimal education and comprehension.      A complete review of systems was documented in the nursing note and was reviewed by me.     Objective:  BP 138/89 (BP Location: Right upper arm, Patient Position: Sitting, BP Cuff Size: Adult)   Pulse 84   Temp 36.7 C (98 F) (Oral)   Ht 172.7 cm (5' 8)   Wt 79.8 kg (176 lb)   BMI 26.76 kg/m   Well appearing Head and face symmetric. CN II-XII grossly intact Hearing grossly normal. External nose  without deformity. Anterior rhinoscopy clear. Oral cavity and pharynx clear, no inflammation, mass or PND. Voice is not hoarse. Salivary glands soft, symmetric, without mass. Neck: no adenopathy or mass.  Respiratory effort is normal.  I have personally reviewed the following tests and reported results:   Imaging: none  PROCEDURE NOTE  To better evaluate the patient's symptoms, sinonasal endoscopy is indicated.  After discussion of risks and benefits, and topical decongestion and anesthesia, an endoscope  was used to perform nasal endoscopy on each side. A time out identifying the patient, the procedure, the location of the procedure and any concerns was performed prior to beginning the procedure.  Procedure: Bilateral Nasal Endoscopy Indication: Chronic sinusitis symptoms  Verbal consent obtained. Entire procedure performed by Dr. Elige Anesthesia: oxymetazoline  & 1% lidocaine  topical Septum deviated: no Turbinate HTY: no Middle meatus: sinus cavities patent bilaterally LEFT: minimal polypoid edema in ethmoid Right: one moderate polyp in middle meatus, not extending into nasal cavity. No purulence  Assessment:    It is my impression that Kirk Hammersmith  DDx: CRS with polyps, improved on dupixent therapy  Plan:   Diagnoses and all orders for this visit:  Nasal polyps Chronic sinusitis, unspecified location  - continue dupixent  Dysphonia -     Ambulatory Referral to Speech Pathology  F/U 1 year

## 2021-10-06 ENCOUNTER — Other Ambulatory Visit: Payer: Self-pay | Admitting: Internal Medicine

## 2021-10-06 DIAGNOSIS — R7401 Elevation of levels of liver transaminase levels: Secondary | ICD-10-CM

## 2021-10-13 ENCOUNTER — Ambulatory Visit: Payer: Managed Care, Other (non HMO)

## 2024-03-11 ENCOUNTER — Emergency Department
Admission: EM | Admit: 2024-03-11 | Discharge: 2024-03-11 | Disposition: A | Discharge status: Home / Self Care | Attending: Emergency Medicine | Admitting: Emergency Medicine

## 2024-03-11 ENCOUNTER — Emergency Department

## 2024-03-11 ENCOUNTER — Other Ambulatory Visit: Payer: Self-pay

## 2024-03-11 DIAGNOSIS — J45909 Unspecified asthma, uncomplicated: Secondary | ICD-10-CM | POA: Diagnosis not present

## 2024-03-11 DIAGNOSIS — R079 Chest pain, unspecified: Secondary | ICD-10-CM

## 2024-03-11 DIAGNOSIS — F419 Anxiety disorder, unspecified: Secondary | ICD-10-CM | POA: Diagnosis not present

## 2024-03-11 DIAGNOSIS — R0789 Other chest pain: Secondary | ICD-10-CM | POA: Diagnosis present

## 2024-03-11 DIAGNOSIS — R42 Dizziness and giddiness: Secondary | ICD-10-CM | POA: Diagnosis not present

## 2024-03-11 LAB — BASIC METABOLIC PANEL WITH GFR
Anion gap: 9 (ref 5–15)
BUN: 10 mg/dL (ref 6–20)
CO2: 27 mmol/L (ref 22–32)
Calcium: 9.9 mg/dL (ref 8.9–10.3)
Chloride: 103 mmol/L (ref 98–111)
Creatinine, Ser: 1.05 mg/dL (ref 0.61–1.24)
GFR, Estimated: >60 mL/min (ref >60)
Glucose, Bld: 104 mg/dL — ABNORMAL HIGH (ref 70–99)
Potassium: 4.1 mmol/L (ref 3.5–5.1)
Sodium: 139 mmol/L (ref 135–145)

## 2024-03-11 LAB — CBC
HCT: 46 % (ref 39.0–52.0)
Hemoglobin: 15.3 g/dL (ref 13.0–17.0)
MCH: 27.7 pg (ref 26.0–34.0)
MCHC: 33.3 g/dL (ref 30.0–36.0)
MCV: 83.2 fL (ref 80.0–100.0)
Platelets: 290 K/uL (ref 150–400)
RBC: 5.53 MIL/uL (ref 4.22–5.81)
RDW: 14.1 % (ref 11.5–15.5)
WBC: 11.3 K/uL — ABNORMAL HIGH (ref 4.0–10.5)
nRBC: 0 % (ref 0.0–0.2)

## 2024-03-11 LAB — TROPONIN I (HIGH SENSITIVITY)
Troponin I (High Sensitivity): 4 ng/L (ref <18)
Troponin I (High Sensitivity): <2 ng/L (ref <18)

## 2024-03-11 MED ORDER — KETOROLAC TROMETHAMINE 30 MG/ML IJ SOLN
30.0000 mg | Freq: Once | INTRAMUSCULAR | Status: AC
  Administered 2024-03-11: 30 mg via INTRAMUSCULAR
  Filled 2024-03-11: qty 1

## 2024-03-11 NOTE — Discharge Instructions (Signed)
 Follow-up with your cardiologist on Friday as scheduled.  You may try taking ibuprofen up to 600 mg every 6 hours for pain (although do not combine this with aspirin).  Return to the ER immediately for new, worsening, or persistent severe chest pain, difficulty breathing, exertional symptoms, weakness or lightheadedness, or any other new or worsening symptoms that concern you.

## 2024-03-11 NOTE — ED Triage Notes (Signed)
 Pt c/o CP that radiates to his neck and jaw for over a week now with h/a. Pt is not taking any medications and no med hx

## 2024-03-11 NOTE — ED Provider Notes (Signed)
 [facility] Provider Note    Event Date/Time   First MD Initiated Contact with Patient 03/11/24 1500     (approximate)   History   Chest Pain   HPI  Jeremiah Hudson is a 43 y.o. male with a history of asthma, GERD, and TB who presents with left-sided chest pain for the last week, persistent course, the prescribed is pressure-like.  He sometimes feels it radiating to his neck or to his left arm.  He states it is actually improved or resolves with exertion.  It seems to be worse with certain positions although he states it is not a positional pain.  He reports some associated lightheadedness but no significant shortness of breath.  He has no cough or fever.  He has no significant leg swelling.  He denies any vomiting.  He is very concerned that this is angina.  He has been taking aspirin with temporary relief.  He thinks that he needs nitroglycerin.  I reviewed the past medical records.  The patient's most recent outpatient encounter that I have access to was that [facility] with the ENT in 2023 for follow-up of polyps.   Physical Exam   Triage Vital Signs: ED Triage Vitals [03/11/24 1438]  Encounter Vitals Group     BP (!) 149/90     Girls Systolic BP Percentile      Girls Diastolic BP Percentile      Boys Systolic BP Percentile      Boys Diastolic BP Percentile      Pulse Rate 89     Resp 16     Temp 98.5 F (36.9 C)     Temp Source Oral     SpO2 100 %     Weight 176 lb (79.8 kg)     Height 5' 8 (1.727 m)     Head Circumference      Peak Flow      Pain Score 4     Pain Loc      Pain Education      Exclude from Growth Chart     Most recent vital signs: Vitals:   03/11/24 1600 03/11/24 1630  BP: 113/78 104/77  Pulse: 88 82  Resp: 15 11  Temp:    SpO2: 96% 97%     General: Awake, no distress.  CV:  Good peripheral perfusion.  Normal heart sounds. Resp:  Normal effort.  Lungs CTAB. Abd:  No distention.  Other:  Anxious appearing.  No  peripheral edema.   ED Results / Procedures / Treatments   Labs (all labs ordered are listed, but only abnormal results are displayed) Labs Reviewed  BASIC METABOLIC PANEL WITH GFR - Abnormal; Notable for the following components:      Result Value   Glucose, Bld 104 (*)    All other components within normal limits  CBC - Abnormal; Notable for the following components:   WBC 11.3 (*)    All other components within normal limits  TROPONIN I (HIGH SENSITIVITY)  TROPONIN I (HIGH SENSITIVITY)     EKG  ED ECG REPORT I, Waylon Cassis, the attending physician, personally viewed and interpreted this ECG.  Date: 03/11/2024 EKG Time: 1439 Rate: 87 Rhythm: normal sinus rhythm QRS Axis: Right axis Intervals: normal ST/T Wave abnormalities: Nonspecific T wave abnormalities Narrative Interpretation: Nonspecific abnormalities with no evidence of acute ischemia    RADIOLOGY  Chest x-ray: I independently viewed and interpreted the images; there is no focal consolidation or edema  PROCEDURES:  Critical Care performed: No  Procedures   MEDICATIONS ORDERED IN ED: Medications  ketorolac  (TORADOL ) 30 MG/ML injection 30 mg (30 mg Intramuscular Given 03/11/24 1600)     IMPRESSION / MDM / ASSESSMENT AND PLAN / ED COURSE  I reviewed the triage vital signs and the nursing notes.  43 year old male with PMH as noted above presents with atypical chest pain for the last week which actually improves with exertion.  He appears somewhat anxious.  His vital signs are normal.  Physical exam is unremarkable for acute findings.  EKG shows mild nonspecific T wave abnormalities but no ischemic findings.  Differential diagnosis includes, but is not limited to, musculoskeletal pain, GERD, anxiety, less likely ACS.  The patient is PERC negative.  There is no clinical evidence for DVT or PE.  We will obtain basic labs, cardiac enzymes, give NSAIDs, and reassess.  Patient's presentation is most  consistent with acute presentation with potential threat to life or bodily function.  The patient is on the cardiac monitor to evaluate for evidence of arrhythmia and/or significant heart rate changes.   ----------------------------------------- 5:59 PM on 03/11/2024 -----------------------------------------  BMP and CBC show no acute findings.  Initial and repeat troponins are both negative.  On reassessment the patient reports significant improved pain with the Toradol .  He appears comfortable and much less anxious.  At this time there is no evidence of ACS.  The patient is stable for discharge home.  I counseled him on the results of the workup and plan of care.  He already has a cardiology appointment scheduled for 5 days from now, so I and advised him to keep this appointment.  I gave strict return precautions, and he expressed understanding.   FINAL CLINICAL IMPRESSION(S) / ED DIAGNOSES   Final diagnoses:  Nonspecific chest pain     Rx / DC Orders   ED Discharge Orders     None        Note:  This document was prepared using Dragon voice recognition software and may include unintentional dictation errors.    Jacolyn Pae, MD 03/11/24 1800

## 2024-04-17 ENCOUNTER — Ambulatory Visit: Admitting: Internal Medicine

## 2024-04-17 ENCOUNTER — Encounter: Payer: Self-pay | Admitting: Internal Medicine

## 2024-04-17 VITALS — BP 116/74 | HR 79 | Temp 97.8°F | Ht 68.0 in | Wt 178.6 lb

## 2024-04-17 DIAGNOSIS — R7303 Prediabetes: Secondary | ICD-10-CM | POA: Diagnosis not present

## 2024-04-17 DIAGNOSIS — J309 Allergic rhinitis, unspecified: Secondary | ICD-10-CM | POA: Diagnosis not present

## 2024-04-17 DIAGNOSIS — E559 Vitamin D deficiency, unspecified: Secondary | ICD-10-CM | POA: Diagnosis not present

## 2024-04-17 DIAGNOSIS — Z1331 Encounter for screening for depression: Secondary | ICD-10-CM

## 2024-04-17 DIAGNOSIS — Z0130 Encounter for examination of blood pressure without abnormal findings: Secondary | ICD-10-CM

## 2024-04-17 NOTE — Progress Notes (Signed)
 New Patient Office Visit  Subjective:  Patient ID: Jeremiah Hudson, male    DOB: 18-Sep-1980  Age: 43 y.o. MRN: [id number]  Chief Complaint  Patient presents with   Establish Care    NPE, jadali pt.    Here to establish IM care after his pcp retired. No complaints, recently diagnosed with cataract in his right eye.    No other concerns at this time.   Past Medical History:  Diagnosis Date   Tuberculosis 2013   took treatment - meds for 6 months    Past Surgical History:  Procedure Laterality Date   ETHMOIDECTOMY Bilateral 07/25/2018   Procedure: ETHMOIDECTOMY AND SPHENOIDOTOMY;  Surgeon: Blair Mt, MD;  Location: [facility];  Service: ENT;  Laterality: Bilateral;   HERNIA REPAIR  2002   [facility]   IMAGE GUIDED SINUS SURGERY Bilateral 07/25/2018   Procedure: IMAGE GUIDED SINUS SURGERY;  Surgeon: Blair Mt, MD;  Location: [facility];  Service: ENT;  Laterality: Bilateral;  NEED STRYKER DISK gave stryker disk to cece 1-06 kp   MAXILLARY ANTROSTOMY Bilateral 07/25/2018   Procedure: MAXILLARY ANTROSTOMY WITH TISSUE REMOVAL;  Surgeon: Blair Mt, MD;  Location: [facility];  Service: ENT;  Laterality: Bilateral;   POLYPECTOMY Bilateral 07/25/2018   Procedure: POLYPECTOMY NASAL;  Surgeon: Blair Mt, MD;  Location: [facility];  Service: ENT;  Laterality: Bilateral;   SEPTOPLASTY  2018   SEPTOPLASTY Bilateral 07/25/2018   Procedure: REVISION SEPTOPLASTY;  Surgeon: Blair Mt, MD;  Location: [facility];  Service: ENT;  Laterality: Bilateral;    Social History   Socioeconomic History   Marital status: Married    Spouse name: Not on file   Number of children: Not on file   Years of education: Not on file   Highest education level: Not on file  Occupational History   Not on file  Tobacco Use   Smoking status: Never   Smokeless tobacco: Never  Vaping Use   Vaping status: Never Used  Substance and Sexual Activity    Alcohol use: Never   Drug use: Never   Sexual activity: Yes  Other Topics Concern   Not on file  Social History Narrative   Not on file   Social Drivers of Health   Financial Resource Strain: Not on file  Food Insecurity: Not on file  Transportation Needs: Not on file  Physical Activity: Not on file  Stress: Not on file  Social Connections: Not on file  Intimate Partner Violence: Not on file    Family History  Problem Relation Age of Onset   Diabetes Mother    Heart disease Father    Heart attack Father     No Known Allergies  Outpatient Medications Prior to Visit  Medication Sig   dupilumab (DUPIXENT) 300 MG/2ML prefilled syringe Inject 300 mg into the skin once. (Patient taking differently: Inject 300 mg into the skin once. Once a month)   [DISCONTINUED] azelastine (ASTELIN) 0.1 % nasal spray Place into both nostrils 2 (two) times daily. Use in each nostril as directed   [DISCONTINUED] cefdinir (OMNICEF) 300 MG capsule Take 300 mg by mouth 2 (two) times daily.   [DISCONTINUED] HYDROcodone -acetaminophen  (NORCO/VICODIN) 5-325 MG tablet Take 1-2 tablets by mouth every 4 (four) hours as needed for moderate pain.   [DISCONTINUED] mometasone (NASONEX) 50 MCG/ACT nasal spray Place 2 sprays into the nose daily.   [DISCONTINUED] predniSONE (DELTASONE) 10 MG tablet Take 10 mg by mouth daily with breakfast. 12  day taper   No facility-administered medications prior to visit.    Review of Systems  Constitutional: Negative.   HENT: Negative.    Eyes: Negative.   Respiratory: Negative.    Cardiovascular:  Positive for chest pain (occasionally with acidic foods but radiates to his jaw and relieved by exertion).  Gastrointestinal:  Positive for heartburn.  Genitourinary: Negative.   Skin: Negative.   Neurological:  Positive for dizziness.  Endo/Heme/Allergies: Negative.        Objective:   BP 116/74   Pulse 79   Temp 97.8 F (36.6 C)   Ht 5' 8 (1.727 m)   Wt 178 lb  9.6 oz (81 kg)   SpO2 98%   BMI 27.16 kg/m   Vitals:   04/17/24 1340  BP: 116/74  Pulse: 79  Temp: 97.8 F (36.6 C)  Height: 5' 8 (1.727 m)  Weight: 178 lb 9.6 oz (81 kg)  SpO2: 98%  BMI (Calculated): 27.16    Physical Exam Vitals reviewed.  Constitutional:      Appearance: Normal appearance.  HENT:     Head: Normocephalic.     Left Ear: There is no impacted cerumen.     Nose: Nose normal.     Mouth/Throat:     Mouth: Mucous membranes are moist.     Pharynx: No posterior oropharyngeal erythema.  Eyes:     Extraocular Movements: Extraocular movements intact.     Pupils: Pupils are equal, round, and reactive to light.  Cardiovascular:     Rate and Rhythm: Regular rhythm.     Chest Wall: PMI is not displaced.     Pulses: Normal pulses.     Heart sounds: Normal heart sounds. No murmur heard. Pulmonary:     Effort: Pulmonary effort is normal.     Breath sounds: Normal air entry. No rhonchi or rales.  Abdominal:     General: Abdomen is flat. Bowel sounds are normal. There is no distension.     Palpations: Abdomen is soft. There is no hepatomegaly, splenomegaly or mass.     Tenderness: There is no abdominal tenderness.  Musculoskeletal:        General: Normal range of motion.     Cervical back: Normal range of motion and neck supple.     Right lower leg: No edema.     Left lower leg: No edema.  Skin:    General: Skin is warm and dry.  Neurological:     General: No focal deficit present.     Mental Status: He is alert and oriented to person, place, and time.     Cranial Nerves: No cranial nerve deficit.     Motor: No weakness.  Psychiatric:        Mood and Affect: Mood normal.        Behavior: Behavior normal.      No results found for any visits on 04/17/24.      Assessment & Plan:  Jeremiah Hudson was seen today for establish care.  Prediabetes -     Type and screen -     CBC With Diff/Platelet -     Comprehensive metabolic panel with GFR -     Lipid  panel -     VITAMIN D 25 Hydroxy (Vit-D Deficiency, Fractures)  Vitamin D deficiency -     Hemoglobin A1c  Allergic sinusitis    Problem List Items Addressed This Visit       Respiratory   Allergic sinusitis  Other   Vitamin D deficiency   Relevant Orders   Hemoglobin A1c   Prediabetes - Primary   Relevant Orders   Type and screen   CBC With Diff/Platelet   Comprehensive metabolic panel with GFR   Lipid panel   Vitamin D (25 hydroxy)    Return in about 3 months (around 07/18/2024) for fu with labs prior.   Total time spent: 30 minutes  Sherrill Cinderella Perry, MD  04/17/2024   This document may have been prepared by [facility] Voice Recognition software and as such may include unintentional dictation errors.

## 2024-04-18 LAB — COMPREHENSIVE METABOLIC PANEL WITH GFR
ALT: 38 IU/L (ref 0–44)
AST: 30 IU/L (ref 0–40)
Albumin: 4.9 g/dL (ref 4.1–5.1)
Alkaline Phosphatase: 128 IU/L — ABNORMAL HIGH (ref 47–123)
BUN/Creatinine Ratio: 9 (ref 9–20)
BUN: 8 mg/dL (ref 6–24)
Bilirubin Total: 0.4 mg/dL (ref 0.0–1.2)
CO2: 24 mmol/L (ref 20–29)
Calcium: 9.8 mg/dL (ref 8.7–10.2)
Chloride: 101 mmol/L (ref 96–106)
Creatinine, Ser: 0.88 mg/dL (ref 0.76–1.27)
Globulin, Total: 3 g/dL (ref 1.5–4.5)
Glucose: 86 mg/dL (ref 70–99)
Potassium: 4.5 mmol/L (ref 3.5–5.2)
Sodium: 139 mmol/L (ref 134–144)
Total Protein: 7.9 g/dL (ref 6.0–8.5)
eGFR: 109 mL/min/1.73 (ref >59)

## 2024-04-18 LAB — CBC WITH DIFF/PLATELET
Basophils Absolute: 0.1 x10E3/uL (ref 0.0–0.2)
Basos: 1 %
EOS (ABSOLUTE): 0.4 x10E3/uL (ref 0.0–0.4)
Eos: 5 %
Hematocrit: 48.2 % (ref 37.5–51.0)
Hemoglobin: 15.3 g/dL (ref 13.0–17.7)
Immature Grans (Abs): 0 x10E3/uL (ref 0.0–0.1)
Immature Granulocytes: 0 %
Lymphocytes Absolute: 3 x10E3/uL (ref 0.7–3.1)
Lymphs: 39 %
MCH: 27.3 pg (ref 26.6–33.0)
MCHC: 31.7 g/dL (ref 31.5–35.7)
MCV: 86 fL (ref 79–97)
Monocytes Absolute: 0.7 x10E3/uL (ref 0.1–0.9)
Monocytes: 8 %
Neutrophils Absolute: 3.7 x10E3/uL (ref 1.4–7.0)
Neutrophils: 47 %
Platelets: 270 x10E3/uL (ref 150–450)
RBC: 5.61 x10E6/uL (ref 4.14–5.80)
RDW: 13.3 % (ref 11.6–15.4)
WBC: 7.8 x10E3/uL (ref 3.4–10.8)

## 2024-04-18 LAB — LIPID PANEL
Chol/HDL Ratio: 3.5 ratio (ref 0.0–5.0)
Cholesterol, Total: 133 mg/dL (ref 100–199)
HDL: 38 mg/dL — ABNORMAL LOW (ref >39)
LDL Chol Calc (NIH): 80 mg/dL (ref 0–99)
Triglycerides: 73 mg/dL (ref 0–149)
VLDL Cholesterol Cal: 15 mg/dL (ref 5–40)

## 2024-04-18 LAB — VITAMIN D 25 HYDROXY (VIT D DEFICIENCY, FRACTURES): Vit D, 25-Hydroxy: 25.6 ng/mL — ABNORMAL LOW (ref 30.0–100.0)

## 2024-04-18 LAB — HEMOGLOBIN A1C
Est. average glucose Bld gHb Est-mCnc: 123 mg/dL
Hgb A1c MFr Bld: 5.9 % — ABNORMAL HIGH (ref 4.8–5.6)

## 2024-04-23 ENCOUNTER — Ambulatory Visit: Payer: Self-pay | Admitting: Internal Medicine

## 2024-04-24 NOTE — Progress Notes (Signed)
 Left VM to return call

## 2024-04-25 NOTE — Progress Notes (Signed)
Pt informed
